# Patient Record
Sex: Male | Born: 1965 | Hispanic: Yes | Marital: Married | Smoking: Never smoker
Health system: Southern US, Community
[De-identification: ages and names within clinical notes are randomized; demographics above are authoritative.]

---

## 2019-08-15 ENCOUNTER — Encounter: Payer: Self-pay | Admitting: *Deleted

## 2019-08-15 ENCOUNTER — Emergency Department: Payer: HRSA Program

## 2019-08-15 ENCOUNTER — Other Ambulatory Visit: Payer: Self-pay

## 2019-08-15 ENCOUNTER — Emergency Department
Admission: EM | Admit: 2019-08-15 | Discharge: 2019-08-15 | Disposition: A | Discharge status: Home / Self Care | Payer: HRSA Program | Attending: Emergency Medicine | Admitting: Emergency Medicine

## 2019-08-15 DIAGNOSIS — U071 COVID-19: Secondary | ICD-10-CM

## 2019-08-15 DIAGNOSIS — R0602 Shortness of breath: Secondary | ICD-10-CM | POA: Diagnosis present

## 2019-08-15 LAB — BASIC METABOLIC PANEL
Anion gap: 10 (ref 5–15)
BUN: 9 mg/dL (ref 6–20)
CO2: 26 mmol/L (ref 22–32)
Calcium: 8.1 mg/dL — ABNORMAL LOW (ref 8.9–10.3)
Chloride: 100 mmol/L (ref 98–111)
Creatinine, Ser: 0.69 mg/dL (ref 0.61–1.24)
GFR calc Af Amer: >60 mL/min (ref >60)
GFR calc non Af Amer: >60 mL/min (ref >60)
Glucose, Bld: 134 mg/dL — ABNORMAL HIGH (ref 70–99)
Potassium: 2.8 mmol/L — ABNORMAL LOW (ref 3.5–5.1)
Sodium: 136 mmol/L (ref 135–145)

## 2019-08-15 LAB — CBC
HCT: 40.2 % (ref 39.0–52.0)
Hemoglobin: 13.7 g/dL (ref 13.0–17.0)
MCH: 29.7 pg (ref 26.0–34.0)
MCHC: 34.1 g/dL (ref 30.0–36.0)
MCV: 87.2 fL (ref 80.0–100.0)
Platelets: 183 10*3/uL (ref 150–400)
RBC: 4.61 MIL/uL (ref 4.22–5.81)
RDW: 13.2 % (ref 11.5–15.5)
WBC: 12.3 10*3/uL — ABNORMAL HIGH (ref 4.0–10.5)
nRBC: 0 % (ref 0.0–0.2)

## 2019-08-15 LAB — SARS CORONAVIRUS 2 BY RT PCR (HOSPITAL ORDER, PERFORMED IN ~~LOC~~ HOSPITAL LAB): SARS Coronavirus 2: POSITIVE — AB

## 2019-08-15 LAB — LACTIC ACID, PLASMA: Lactic Acid, Venous: 1 mmol/L (ref 0.5–1.9)

## 2019-08-15 MED ORDER — SODIUM CHLORIDE 0.9 % IV BOLUS
1000.0000 mL | Freq: Once | INTRAVENOUS | Status: AC
  Administered 2019-08-15: 14:00:00 1000 mL via INTRAVENOUS

## 2019-08-15 MED ORDER — AZITHROMYCIN 250 MG PO TABS: ORAL_TABLET | ORAL | 0 refills | Status: DC

## 2019-08-15 MED ORDER — POTASSIUM CHLORIDE ER 10 MEQ PO TBCR: 10.0000 meq | EXTENDED_RELEASE_TABLET | Freq: Every day | ORAL | 0 refills | Status: AC

## 2019-08-15 MED ORDER — ONDANSETRON 4 MG PO TBDP: 4.0000 mg | ORAL_TABLET | Freq: Four times a day (QID) | ORAL | 0 refills | Status: AC | PRN

## 2019-08-15 MED ORDER — POTASSIUM CHLORIDE CRYS ER 20 MEQ PO TBCR
40.0000 meq | EXTENDED_RELEASE_TABLET | Freq: Once | ORAL | Status: AC
  Administered 2019-08-15: 40 meq via ORAL
  Filled 2019-08-15: qty 2

## 2019-08-15 MED ORDER — AZITHROMYCIN 500 MG PO TABS
500.0000 mg | ORAL_TABLET | Freq: Once | ORAL | Status: AC
  Administered 2019-08-15: 14:00:00 500 mg via ORAL
  Filled 2019-08-15: qty 1

## 2019-08-15 MED ORDER — ACETAMINOPHEN 500 MG PO TABS
1000.0000 mg | ORAL_TABLET | ORAL | Status: AC
  Administered 2019-08-15: 14:00:00 1000 mg via ORAL
  Filled 2019-08-15: qty 2

## 2019-08-15 NOTE — ED Notes (Signed)
2 sets of cultures sent to lab along with lactic on ice and another grn tube if 2nd trop desired.

## 2019-08-15 NOTE — ED Notes (Signed)
Pt denies CP; denies SOB or difficulty breathing; denies cough outside of normal; c/o body aches and fever. Did not check temp at home but states pt felt hot and was sweaty at times. Pt states took OTC meds at home for fever. Family member at bedside.

## 2019-08-15 NOTE — ED Provider Notes (Signed)
[facility] Emergency Department Provider Note   ____________________________________________   First MD Initiated Contact with Patient 08/15/19 1221     (approximate)  I have reviewed the triage vital signs and the nursing notes.   HISTORY  Chief Complaint Shortness of Breath and Generalized Body Aches  Offered use of a Spanish medical interpreter, the patient declined this.  He wishes to have his son assist him, and does not wish to engage in interpreter but will request  if needed  HPI Spencer Gray is a 53 y.o. male with no past medical history   On Thursday he began to develop a light headache, developing a body aches throughout his body.  He has had a runny nose, dry cough congestion.  No significant shortness of breath.  No chest pain.  No nausea vomiting.  He is continue to feel fatigued, yesterday he felt a little lightheaded throughout the day  He has been using occasional ibuprofen and Tylenol, last took last night  He does not know of any direct exposure to coronavirus but does work.  No one else in the family that he lives with has had any symptoms  History reviewed. No pertinent past medical history.  There are no active problems to display for this patient.   History reviewed. No pertinent surgical history.  Prior to Admission medications   Medication Sig Start Date End Date Taking? Authorizing Provider  azithromycin (ZITHROMAX Z-PAK) 250 MG tablet 1 tab by mouth daily 08/15/19   Sharyn CreamerQuale, , MD  ondansetron (ZOFRAN ODT) 4 MG disintegrating tablet Take 1 tablet (4 mg total) by mouth every 6 (six) hours as needed for nausea or vomiting. 08/15/19   Sharyn CreamerQuale, , MD  potassium chloride (K-DUR) 10 MEQ tablet Take 1 tablet (10 mEq total) by mouth daily. 08/15/19   Sharyn CreamerQuale, , MD    Allergies Patient has no known allergies.  History reviewed. No pertinent family history.  Social History Social History   Tobacco Use  . Smoking status:  Never Smoker  . Smokeless tobacco: Never Used  Substance Use Topics  . Alcohol use: Never    Frequency: Never  . Drug use: Never    Review of Systems Constitutional: Fevers and chills and some mild fatigue Eyes: No visual changes. ENT: No sore throat. Cardiovascular: Denies chest pain. Respiratory: Denies shortness of breath.  Cough Gastrointestinal: No abdominal pain.   Genitourinary: Negative for dysuria. Musculoskeletal: Negative for back pain.  Some muscle aches Skin: Negative for rash. Neurological: Negative for headaches, areas of focal weakness or numbness.  Did have a moderate to mild headache when this first started    ____________________________________________   PHYSICAL EXAM:  VITAL SIGNS: ED Triage Vitals  Enc Vitals Group     BP 08/15/19 1054 117/74     Pulse Rate 08/15/19 1054 (!) 113     Resp 08/15/19 1054 20     Temp 08/15/19 1054 98.4 F (36.9 C)     Temp Source 08/15/19 1054 Oral     SpO2 08/15/19 1054 92 %     Weight 08/15/19 1055 192 lb (87.1 kg)     Height 08/15/19 1055 5\' 1"  (1.549 m)     Head Circumference --      Peak Flow --      Pain Score 08/15/19 1055 10     Pain Loc --      Pain Edu? --      Excl. in [place]? --     Constitutional:  Alert and oriented.  Mildly ill-appearing in no acute extremitas. Eyes: Conjunctivae are normal. Head: Atraumatic. Nose: No congestion/rhinnorhea. Mouth/Throat: Mucous membranes are moist. Neck: No stridor.  Cardiovascular: Normal rate, regular rhythm. Grossly normal heart sounds.  Good peripheral circulation. Respiratory: Normal respiratory effort.  No retractions. Lungs CTAB.  He speaks in clear sentences.  Exhibits occasional dry cough. Gastrointestinal: Soft and nontender. No distention. Musculoskeletal: No lower extremity tenderness nor edema. Neurologic:  Normal speech and language. No gross focal neurologic deficits are appreciated.  Skin:  Skin is warm, dry and intact. No rash noted. Psychiatric:  Mood and affect are normal. Speech and behavior are normal.  ____________________________________________   LABS (all labs ordered are listed, but only abnormal results are displayed)  Labs Reviewed  SARS CORONAVIRUS 2 (HOSPITAL ORDER, PERFORMED IN [facility]) - Abnormal; Notable for the following components:      Result Value   SARS Coronavirus 2 POSITIVE (*)    All other components within normal limits  CBC - Abnormal; Notable for the following components:   WBC 12.3 (*)    All other components within normal limits  BASIC METABOLIC PANEL - Abnormal; Notable for the following components:   Potassium 2.8 (*)    Glucose, Bld 134 (*)    Calcium 8.1 (*)    All other components within normal limits  CULTURE, BLOOD (ROUTINE X 2)  CULTURE, BLOOD (ROUTINE X 2)  LACTIC ACID, PLASMA   ____________________________________________  EKG Reviewed entered by me at 11 AM Heart rate 110 PR 120 QRS 90 QTc 440 Sinus tachycardia, no evidence of acute ischemia  ____________________________________________  RADIOLOGY  Dg Chest Portable 1 View  Result Date: 08/15/2019 CLINICAL DATA:  Possible COVID-19.  Fever, body aches and dyspnea. EXAM: PORTABLE CHEST 1 VIEW COMPARISON:  None. FINDINGS: Patchy parenchymal lung densities bilaterally. Heart size is within normal limits. The trachea is midline. Question small calcifications in the hilar regions and right upper lung. Bone structures are unremarkable. Negative for a pneumothorax. IMPRESSION: Patchy bilateral parenchymal lung densities. Findings are concerning for pneumonia. Electronically Signed   By: Richarda Overlie M.D.   On: 08/15/2019 12:10    Imaging reviewed, concerning for patchy bilateral infiltrates ____________________________________________   PROCEDURES  Procedure(s) performed: None  Procedures  Critical Care performed: No  ____________________________________________   INITIAL IMPRESSION / ASSESSMENT AND PLAN /  ED COURSE  Pertinent labs & imaging results that were available during my care of the patient were reviewed by me and considered in my medical decision making (see chart for details).   [name] was evaluated in Emergency Department on 08/15/2019 for the symptoms described in the history of present illness. He was evaluated in the context of the global COVID-19 pandemic, which necessitated consideration that the patient might be at risk for infection with the SARS-CoV-2 virus that causes COVID-19. Institutional protocols and algorithms that pertain to the evaluation of patients at risk for COVID-19 are in a state of rapid change based on information released by regulatory bodies including the CDC and federal and state organizations. These policies and algorithms were followed during the patient's care in the ED.  Patient upper respiratory/viral symptoms.  Chest x-ray reveals bilateral infiltrative disease, COVID test positive.  Based on clinical history appears consistent with COVID-19 infection, heart rate improved, his oxygenation is normal without oxygen requirement, lowest oxygen saturation seen here 90% on room air but primarily 95%    ----------------------------------------- 2:11 PM on 08/15/2019 -----------------------------------------  Patient resting comfortably.  Heart rate improved, normal work of breathing, just slightly increased respiratory rate.  Saturation 95% on room air.  Patient denies shortness of breath.  Utilized Therapist, occupational, discussed with both the patient and his son diagnosis, treatment recommendations, and advised very careful return precautions as well as follow-up via the health department who is actively following coded cases.  Patient is comfortable with this plan, both he and his son are comfortable with discharge, will quarantine.  Return precautions and treatment recommendations and follow-up discussed with the patient who is agreeable with the  plan.   ____________________________________________   FINAL CLINICAL IMPRESSION(S) / ED DIAGNOSES  Final diagnoses:  COVID-19 virus infection        Note:  This document was prepared using Dragon voice recognition software and may include unintentional dictation errors       Delman Kitten, MD 08/15/19 1412

## 2019-08-15 NOTE — ED Notes (Signed)
Pt placed on 2L [place]

## 2019-08-15 NOTE — ED Triage Notes (Signed)
Pt to ED reporting fever, body aches, cough, SOB and headaches since Wednesday of last week. PT has been working and exposed to many people but is unsure of a confirmed exposure to [place]. NO fever at this time but pt reports having taken Advil this morning.

## 2019-08-20 LAB — CULTURE, BLOOD (ROUTINE X 2)
Culture: NO GROWTH
Culture: NO GROWTH
Special Requests: ADEQUATE

## 2020-08-13 IMAGING — DX DG CHEST 1V PORT
1 series · 1 of 1 positions shown · non-contrast
Comparison: None.

CLINICAL DATA: Possible [id number].  Fever, body aches and dyspnea.

EXAM:
PORTABLE CHEST 1 VIEW

[chest ap]
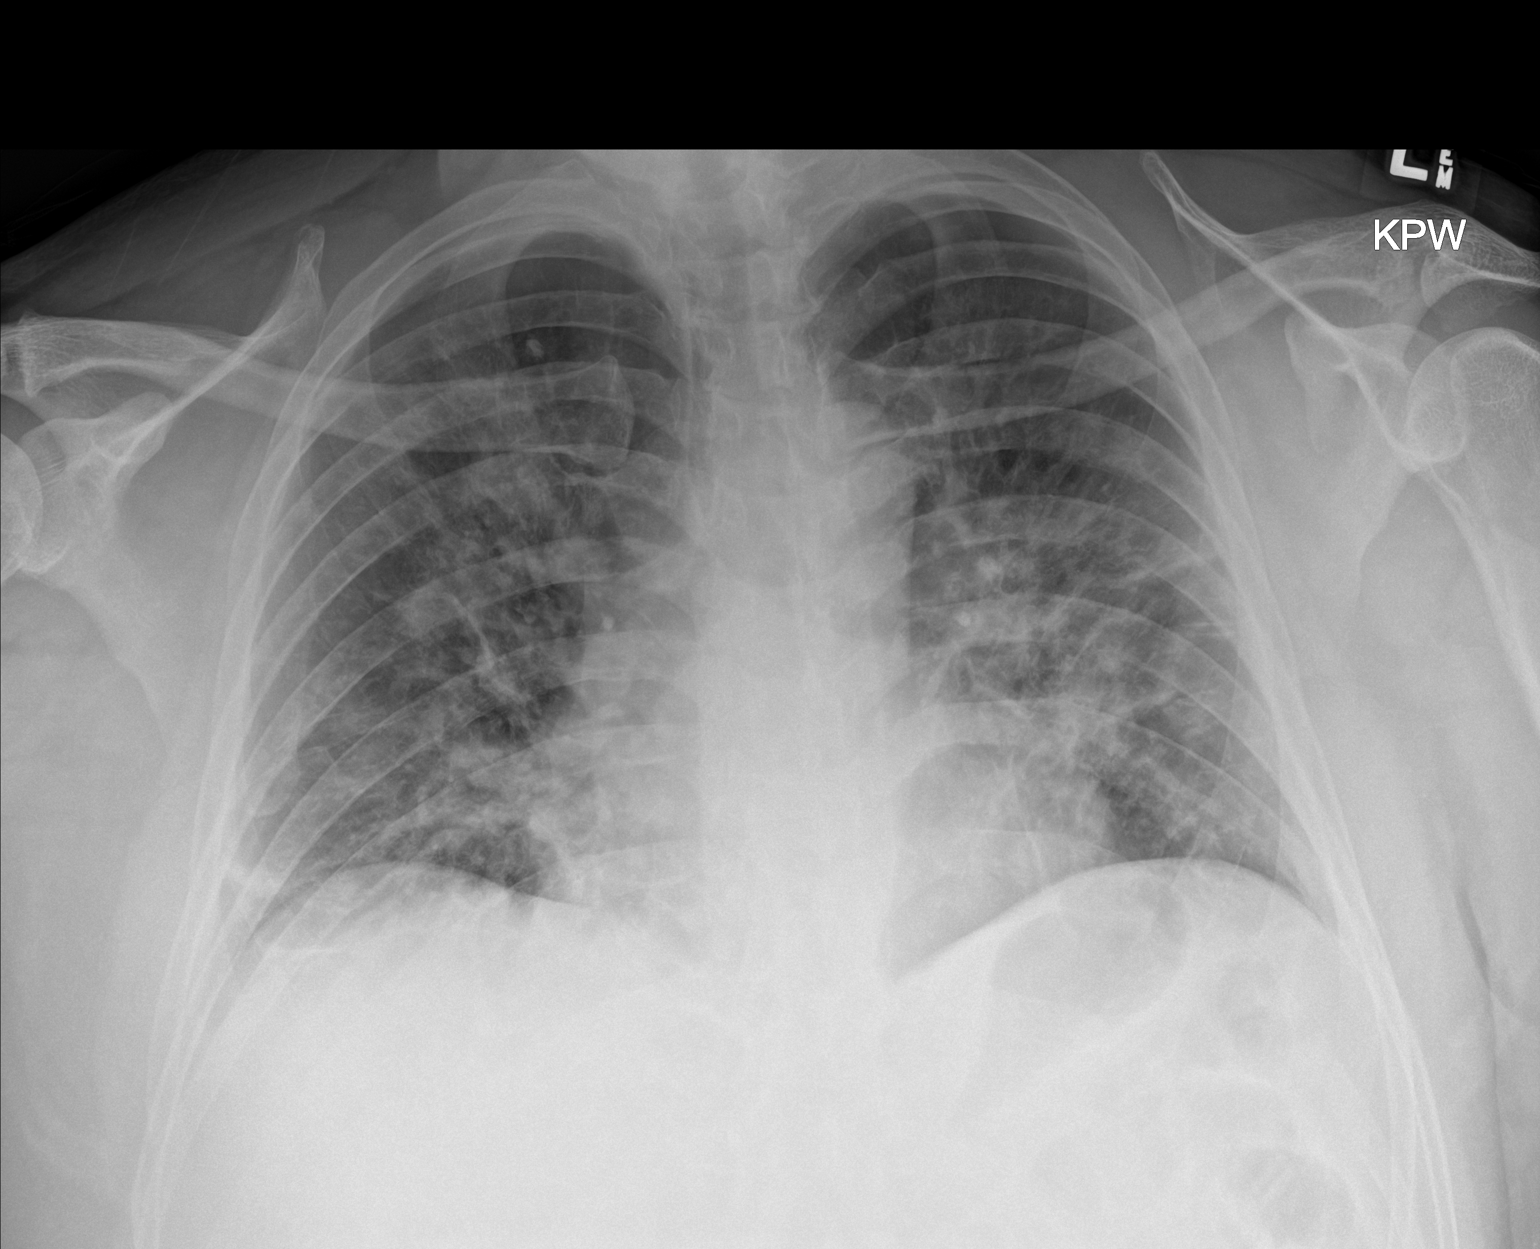

[1 of 1 positions shown; findings below may reference images not displayed]

FINDINGS: Patchy parenchymal lung densities bilaterally. Heart size is within
normal limits. The trachea is midline. Question small calcifications
in the hilar regions and right upper lung. Bone structures are
unremarkable. Negative for a pneumothorax.
IMPRESSION: Patchy bilateral parenchymal lung densities. Findings are concerning
for pneumonia.

## 2022-05-12 ENCOUNTER — Encounter: Payer: Self-pay | Admitting: Emergency Medicine

## 2022-05-12 ENCOUNTER — Emergency Department
Admission: EM | Admit: 2022-05-12 | Discharge: 2022-05-12 | Disposition: A | Discharge status: Home / Self Care | Payer: Self-pay | Attending: Student in an Organized Health Care Education/Training Program | Admitting: Student in an Organized Health Care Education/Training Program

## 2022-05-12 ENCOUNTER — Emergency Department: Payer: Self-pay

## 2022-05-12 ENCOUNTER — Other Ambulatory Visit: Payer: Self-pay

## 2022-05-12 DIAGNOSIS — T148XXA Other injury of unspecified body region, initial encounter: Secondary | ICD-10-CM

## 2022-05-12 DIAGNOSIS — S62630B Displaced fracture of distal phalanx of right index finger, initial encounter for open fracture: Secondary | ICD-10-CM | POA: Insufficient documentation

## 2022-05-12 DIAGNOSIS — S61310A Laceration without foreign body of right index finger with damage to nail, initial encounter: Secondary | ICD-10-CM | POA: Insufficient documentation

## 2022-05-12 DIAGNOSIS — W230XXA Caught, crushed, jammed, or pinched between moving objects, initial encounter: Secondary | ICD-10-CM | POA: Insufficient documentation

## 2022-05-12 MED ORDER — CEPHALEXIN 500 MG PO CAPS: 500.0000 mg | ORAL_CAPSULE | Freq: Four times a day (QID) | ORAL | 0 refills | Status: AC

## 2022-05-12 MED ORDER — LIDOCAINE HCL 1 % IJ SOLN
10.0000 mL | Freq: Once | INTRAMUSCULAR | Status: AC
  Administered 2022-05-12: 10 mL
  Filled 2022-05-12: qty 10

## 2022-05-12 NOTE — ED Provider Notes (Signed)
[facility] Provider Note  Patient Contact: 6:01 PM (approximate)   History   Laceration   HPI  Spencer Gray is a 56 y.o. male presents to the emergency department after patient crushed his finger between a piece of metal in a trash can.  [facility] and received tetanus shot prior to being referred.  Fingernail is subluxed approximately.  No similar injuries in the past.      Physical Exam   Triage Vital Signs: ED Triage Vitals  Enc Vitals Group     BP 05/12/22 1603 137/90     Pulse Rate 05/12/22 1603 (!) 109     Resp 05/12/22 1603 20     Temp 05/12/22 1603 98.6 F (37 C)     Temp Source 05/12/22 1603 Oral     SpO2 05/12/22 1603 95 %     Weight 05/12/22 1605 216 lb (98 kg)     Height 05/12/22 1605 5\' 4"  (1.626 m)     Head Circumference --      Peak Flow --      Pain Score 05/12/22 1604 2     Pain Loc --      Pain Edu? --      Excl. in [place]? --     Most recent vital signs: Vitals:   05/12/22 1603  BP: 137/90  Pulse: (!) 109  Resp: 20  Temp: 98.6 F (37 C)  SpO2: 95%     General: Alert and in no acute distress. Eyes:  PERRL. EOMI. Head: No acute traumatic findings ENT:      Nose: No congestion/rhinnorhea.      Mouth/Throat: Mucous membranes are moist. Neck: No stridor. No cervical spine tenderness to palpation. Cardiovascular:  Good peripheral perfusion Respiratory: Normal respiratory effort without tachypnea or retractions. Lungs CTAB. Good air entry to the bases with no decreased or absent breath sounds. Gastrointestinal: Bowel sounds 4 quadrants. Soft and nontender to palpation. No guarding or rigidity. No palpable masses. No distention. No CVA tenderness. Musculoskeletal: Patient has linear laceration of distal right index finger across his DIP joint with displaced fingernail.  No deficits noted with flexor and extensor tendon testing.  Palpable radial and ulnar pulses bilaterally and  symmetrically. Neurologic:  No gross focal neurologic deficits are appreciated.  Skin:   No rash noted Other:   ED Results / Procedures / Treatments   Labs (all labs ordered are listed, but only abnormal results are displayed) Labs Reviewed - No data to display      RADIOLOGY  I personally viewed and evaluated these images as part of my medical decision making, as well as reviewing the written report by the radiologist.  ED Provider Interpretation: Patient has comminuted distal tuft fracture of right index finger.   PROCEDURES:  Critical Care performed: No  ..Laceration Repair  Date/Time: 05/12/2022 6:17 PM Performed by: Lannie Fields, PA-C Authorized by: Lannie Fields, PA-C   Consent:    Consent obtained:  Verbal   Risks discussed:  Infection and pain Universal protocol:    Procedure explained and questions answered to patient or proxy's satisfaction: yes     Patient identity confirmed:  Verbally with patient Anesthesia:    Anesthesia method:  Nerve block   Block needle gauge:  25 G   Block anesthetic:  Lidocaine 1% w/o epi   Block technique:  Digital Laceration details:    Location:  Finger   Finger location:  R index finger  Length (cm):  2.5   Depth (mm):  5 Pre-procedure details:    Preparation:  Patient was prepped and draped in usual sterile fashion Exploration:    Limited defect created (wound extended): no     Imaging obtained: x-ray     Contaminated: yes   Treatment:    Area cleansed with:  Povidone-iodine   Amount of cleaning:  Extensive   Irrigation volume:  500   Visualized foreign bodies/material removed: no     Debridement:  None   Undermining:  None   Scar revision: no   Skin repair:    Repair method:  Sutures   Suture size:  4-0   Suture technique:  Running locked   Number of sutures:  12 Approximation:    Approximation:  Close Repair type:    Repair type:  Complex Post-procedure details:    Dressing:  Non-adherent dressing  and splint for protection   MEDICATIONS ORDERED IN ED: Medications  lidocaine (XYLOCAINE) 1 % (with pres) injection 10 mL (10 mLs Infiltration Given by Other 05/12/22 1719)     IMPRESSION / MDM / [place] / ED COURSE  I reviewed the triage vital signs and the nursing notes.                             Assessment and plan:  Open fracture Laceration:  Differential diagnosis includes, but is not limited to, open fracture, comminuted distal tuft fracture, laceration...  56 year old male presents to the emergency department with a linear laceration with displaced fingernail after a crush type injury.  Patient was tachycardic at triage but vital signs were otherwise reassuring.  He was alert, active and nontoxic-appearing.  X-ray indicated a comminuted distal tuft fracture.    Patient's hands had a copious amount of grease on them and digit was scrubbed vigorously with chlorhexidine soap to remove as much debris as possible.  Laceration was repaired in the emergency department without complication fingernail was sutured in place.  I recommended follow-up with orthopedics.  Patient's right index finger was splinted into extension prior to discharge.  Tetanus status was updated at urgent care prior to referral.  Patient was discharged with Keflex.  Return precautions were given to return with any redness or streaking surrounding the wound site.       FINAL CLINICAL IMPRESSION(S) / ED DIAGNOSES   Final diagnoses:  Laceration of right index finger without foreign body with damage to nail, initial encounter  Open fracture     Rx / DC Orders   ED Discharge Orders          Ordered    cephALEXin (KEFLEX) 500 MG capsule  4 times daily        05/12/22 1751             Note:  This document was prepared using Dragon voice recognition software and may include unintentional dictation errors.   Vallarie Mare Morenci, PA-C 05/12/22 1818    Merlyn Lot, MD 05/12/22  [phone]

## 2022-05-12 NOTE — ED Triage Notes (Signed)
Pt via POV from home. Pt states that his smashed his finger between some metal and a trash. Laceration noted to the R index finger, bleeding controlled with fingernail involvement. Pt is A&OX4 and NAD  Pt is a workers compensation case. Pt works with [employer]  Pt received his tetanus shot at [facility] today.

## 2022-05-12 NOTE — Discharge Instructions (Addendum)
Take Keflex 4 times daily for the next seven days.  Have sutures removed in ten days.  Please make follow up appointment with orthopedics.  You can take Tylenol and Ibuprofen alternating for pain. Return if you see any redness or streaking surrounding wound site.
